# Patient Record
Sex: Female | Born: 2005 | Marital: Single | Smoking: Never smoker
Health system: Southern US, Community
[De-identification: ages and names within clinical notes are randomized; demographics above are authoritative.]

## PROBLEM LIST (undated history)

## (undated) DIAGNOSIS — E669 Obesity, unspecified: Secondary | ICD-10-CM

## (undated) DIAGNOSIS — L83 Acanthosis nigricans: Secondary | ICD-10-CM

## (undated) DIAGNOSIS — E559 Vitamin D deficiency, unspecified: Secondary | ICD-10-CM

## (undated) DIAGNOSIS — E221 Hyperprolactinemia: Secondary | ICD-10-CM

## (undated) HISTORY — DX: Hyperprolactinemia: E22.1

## (undated) HISTORY — PX: NO PAST SURGERIES: SHX2092

## (undated) HISTORY — DX: Vitamin D deficiency, unspecified: E55.9

## (undated) HISTORY — DX: Obesity, unspecified: E66.9

## (undated) HISTORY — DX: Acanthosis nigricans: L83

---
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
## 2023-05-28 DIAGNOSIS — E669 Obesity, unspecified: Secondary | ICD-10-CM | POA: Diagnosis not present

## 2023-05-28 DIAGNOSIS — Z0000 Encounter for general adult medical examination without abnormal findings: Secondary | ICD-10-CM | POA: Diagnosis not present

## 2023-05-28 DIAGNOSIS — Z13220 Encounter for screening for lipoid disorders: Secondary | ICD-10-CM | POA: Diagnosis not present

## 2023-05-28 DIAGNOSIS — A084 Viral intestinal infection, unspecified: Secondary | ICD-10-CM | POA: Diagnosis not present

## 2023-05-28 DIAGNOSIS — E559 Vitamin D deficiency, unspecified: Secondary | ICD-10-CM | POA: Diagnosis not present

## 2023-05-28 DIAGNOSIS — Z7689 Persons encountering health services in other specified circumstances: Secondary | ICD-10-CM | POA: Diagnosis not present

## 2023-08-08 DIAGNOSIS — Z713 Dietary counseling and surveillance: Secondary | ICD-10-CM | POA: Diagnosis not present

## 2023-08-08 DIAGNOSIS — E559 Vitamin D deficiency, unspecified: Secondary | ICD-10-CM | POA: Diagnosis not present

## 2023-08-08 DIAGNOSIS — Z7182 Exercise counseling: Secondary | ICD-10-CM | POA: Diagnosis not present

## 2023-08-08 DIAGNOSIS — R748 Abnormal levels of other serum enzymes: Secondary | ICD-10-CM | POA: Diagnosis not present

## 2023-08-08 DIAGNOSIS — Z0000 Encounter for general adult medical examination without abnormal findings: Secondary | ICD-10-CM | POA: Diagnosis not present

## 2023-08-08 DIAGNOSIS — K141 Geographic tongue: Secondary | ICD-10-CM | POA: Diagnosis not present

## 2023-08-08 DIAGNOSIS — E669 Obesity, unspecified: Secondary | ICD-10-CM | POA: Diagnosis not present

## 2023-09-05 DIAGNOSIS — R748 Abnormal levels of other serum enzymes: Secondary | ICD-10-CM | POA: Diagnosis not present

## 2024-01-08 DIAGNOSIS — E559 Vitamin D deficiency, unspecified: Secondary | ICD-10-CM | POA: Diagnosis not present

## 2024-01-08 DIAGNOSIS — E538 Deficiency of other specified B group vitamins: Secondary | ICD-10-CM | POA: Diagnosis not present

## 2024-01-08 DIAGNOSIS — H6090 Unspecified otitis externa, unspecified ear: Secondary | ICD-10-CM | POA: Diagnosis not present

## 2024-01-08 DIAGNOSIS — E669 Obesity, unspecified: Secondary | ICD-10-CM | POA: Diagnosis not present

## 2024-01-08 DIAGNOSIS — Z7189 Other specified counseling: Secondary | ICD-10-CM | POA: Diagnosis not present

## 2024-01-08 DIAGNOSIS — R748 Abnormal levels of other serum enzymes: Secondary | ICD-10-CM | POA: Diagnosis not present

## 2024-01-08 DIAGNOSIS — Z713 Dietary counseling and surveillance: Secondary | ICD-10-CM | POA: Diagnosis not present

## 2024-01-08 DIAGNOSIS — Z23 Encounter for immunization: Secondary | ICD-10-CM | POA: Diagnosis not present

## 2024-01-08 DIAGNOSIS — Z7182 Exercise counseling: Secondary | ICD-10-CM | POA: Diagnosis not present
